# Patient Record
Sex: Female | Born: 1935 | Race: White | Hispanic: No | Marital: Married | Smoking: Never smoker
Health system: Southern US, Community
[De-identification: ages and names within clinical notes are randomized; demographics above are authoritative.]

## PROBLEM LIST (undated history)

## (undated) DIAGNOSIS — E785 Hyperlipidemia, unspecified: Secondary | ICD-10-CM

## (undated) DIAGNOSIS — E039 Hypothyroidism, unspecified: Secondary | ICD-10-CM

## (undated) DIAGNOSIS — R002 Palpitations: Secondary | ICD-10-CM

## (undated) DIAGNOSIS — I10 Essential (primary) hypertension: Secondary | ICD-10-CM

## (undated) DIAGNOSIS — E119 Type 2 diabetes mellitus without complications: Secondary | ICD-10-CM

## (undated) HISTORY — DX: Essential (primary) hypertension: I10

## (undated) HISTORY — DX: Type 2 diabetes mellitus without complications: E11.9

## (undated) HISTORY — DX: Hypothyroidism, unspecified: E03.9

## (undated) HISTORY — DX: Palpitations: R00.2

## (undated) HISTORY — DX: Hyperlipidemia, unspecified: E78.5

---

## 2002-09-16 ENCOUNTER — Encounter: Payer: Self-pay | Admitting: Unknown Physician Specialty

## 2002-09-16 ENCOUNTER — Ambulatory Visit (HOSPITAL_COMMUNITY): Admission: RE | Admit: 2002-09-16 | Discharge: 2002-09-16 | Payer: Self-pay | Admitting: Unknown Physician Specialty

## 2002-09-19 ENCOUNTER — Encounter: Payer: Self-pay | Admitting: Unknown Physician Specialty

## 2002-09-19 ENCOUNTER — Ambulatory Visit (HOSPITAL_COMMUNITY): Admission: RE | Admit: 2002-09-19 | Discharge: 2002-09-19 | Payer: Self-pay | Admitting: Unknown Physician Specialty

## 2002-10-08 ENCOUNTER — Ambulatory Visit (HOSPITAL_COMMUNITY): Admission: RE | Admit: 2002-10-08 | Discharge: 2002-10-08 | Payer: Self-pay | Admitting: Internal Medicine

## 2002-10-27 ENCOUNTER — Ambulatory Visit (HOSPITAL_COMMUNITY): Admission: RE | Admit: 2002-10-27 | Discharge: 2002-10-27 | Payer: Self-pay | Admitting: Internal Medicine

## 2002-10-27 ENCOUNTER — Encounter: Payer: Self-pay | Admitting: Internal Medicine

## 2003-02-17 ENCOUNTER — Ambulatory Visit (HOSPITAL_COMMUNITY): Admission: RE | Admit: 2003-02-17 | Discharge: 2003-02-17 | Payer: Self-pay | Admitting: Unknown Physician Specialty

## 2003-03-04 ENCOUNTER — Emergency Department (HOSPITAL_COMMUNITY): Admission: EM | Admit: 2003-03-04 | Discharge: 2003-03-04 | Payer: Self-pay | Admitting: Emergency Medicine

## 2003-11-19 ENCOUNTER — Ambulatory Visit (HOSPITAL_COMMUNITY): Admission: RE | Admit: 2003-11-19 | Discharge: 2003-11-19 | Payer: Self-pay | Admitting: Unknown Physician Specialty

## 2004-05-06 ENCOUNTER — Ambulatory Visit: Payer: Self-pay | Admitting: Family Medicine

## 2004-09-22 ENCOUNTER — Ambulatory Visit (HOSPITAL_COMMUNITY): Admission: RE | Admit: 2004-09-22 | Discharge: 2004-09-22 | Payer: Self-pay | Admitting: Ophthalmology

## 2004-11-10 ENCOUNTER — Ambulatory Visit (HOSPITAL_COMMUNITY): Admission: RE | Admit: 2004-11-10 | Discharge: 2004-11-10 | Payer: Self-pay | Admitting: Ophthalmology

## 2004-12-01 ENCOUNTER — Ambulatory Visit: Payer: Self-pay | Admitting: Family Medicine

## 2004-12-14 ENCOUNTER — Ambulatory Visit (HOSPITAL_COMMUNITY): Admission: RE | Admit: 2004-12-14 | Discharge: 2004-12-14 | Payer: Self-pay | Admitting: Family Medicine

## 2005-01-17 ENCOUNTER — Ambulatory Visit: Payer: Self-pay | Admitting: Family Medicine

## 2005-12-19 ENCOUNTER — Ambulatory Visit (HOSPITAL_COMMUNITY): Admission: RE | Admit: 2005-12-19 | Discharge: 2005-12-19 | Payer: Self-pay | Admitting: Family Medicine

## 2006-01-05 ENCOUNTER — Ambulatory Visit: Payer: Self-pay | Admitting: Family Medicine

## 2006-01-19 ENCOUNTER — Ambulatory Visit: Payer: Self-pay | Admitting: Physician Assistant

## 2006-01-25 ENCOUNTER — Ambulatory Visit: Payer: Self-pay | Admitting: Cardiology

## 2006-02-15 ENCOUNTER — Ambulatory Visit: Payer: Self-pay | Admitting: Cardiology

## 2006-07-13 ENCOUNTER — Ambulatory Visit: Payer: Self-pay | Admitting: Family Medicine

## 2006-08-13 ENCOUNTER — Ambulatory Visit: Payer: Self-pay | Admitting: Cardiology

## 2006-08-20 ENCOUNTER — Encounter (INDEPENDENT_AMBULATORY_CARE_PROVIDER_SITE_OTHER): Payer: Self-pay | Admitting: Internal Medicine

## 2006-12-21 ENCOUNTER — Ambulatory Visit (HOSPITAL_COMMUNITY): Admission: RE | Admit: 2006-12-21 | Discharge: 2006-12-21 | Payer: Self-pay | Admitting: Family Medicine

## 2007-12-24 ENCOUNTER — Ambulatory Visit (HOSPITAL_COMMUNITY): Admission: RE | Admit: 2007-12-24 | Discharge: 2007-12-24 | Payer: Self-pay | Admitting: Family Medicine

## 2008-12-28 ENCOUNTER — Ambulatory Visit (HOSPITAL_COMMUNITY): Admission: RE | Admit: 2008-12-28 | Discharge: 2008-12-28 | Payer: Self-pay | Admitting: Family Medicine

## 2009-11-29 ENCOUNTER — Emergency Department (HOSPITAL_COMMUNITY): Admission: EM | Admit: 2009-11-29 | Discharge: 2009-11-29 | Payer: Self-pay | Admitting: Emergency Medicine

## 2009-12-30 ENCOUNTER — Ambulatory Visit (HOSPITAL_COMMUNITY): Admission: RE | Admit: 2009-12-30 | Discharge: 2009-12-30 | Payer: Self-pay | Admitting: Family Medicine

## 2010-07-26 NOTE — Assessment & Plan Note (Signed)
[facility]                            CARDIOLOGY OFFICE NOTE   Rebecca, [name]                       MRN:          [id number]  DATE:08/13/2006                            DOB:          [birth year]    PRIMARY CARE PHYSICIAN:  Delaney Meigs, M.D.   REASON FOR VISIT:  A patient with multiple cardiovascular risk factors.   HISTORY OF PRESENT ILLNESS:  The patient returns for 82-month followup.  At the last visit, I performed an exercise treadmill test.  I found no  evidence of high-grade obstructive coronary artery disease and gave her  a specific recipe for exercise.  I am happy to see that she is  exercising most weeks, 5 days a week.  She goes to a 10% grade.  She  says she still sees her heart rate go up significantly very quickly.  She has not lost any weight, however, she feels good.  She is not  getting any chest discomfort.  She thinks her breathing is a little bit  better.  She has had no palpitations, presyncope or syncope.  She denies  any PND or orthopnea.   PAST MEDICAL HISTORY:  1. Dyslipidemia.  2. Appendectomy.  3. Foot surgery.  4. Hysterectomy.  5. Cyst recently resected from both breasts.   ALLERGIES:  NO KNOWN DRUG ALLERGIES.   MEDICATIONS:  Tums and vitamin D.   REVIEW OF SYSTEMS:  As stated in the HPI, otherwise negative for other  systems.   PHYSICAL EXAMINATION:  GENERAL:  The patient is in no distress.  VITAL SIGNS:  Blood pressure 122/79, heart rate 68 and regular, weight  199 pounds.  Body mass index 32.  NECK:  No jugular venous distention at 45 degrees.  Carotid upstrokes  brisk and symmetric.  No bruits or thyromegaly.  LYMPHATICS:  No adenopathy.  LUNGS:  Clear to auscultation bilaterally.  BACK:  No costovertebral angle tenderness.  CHEST:  Unremarkable.  HEART:  PMI not displaced or sustained.  S1, S2 within normal limits.  No S3, S4.  No clicks, murmurs or rubs.  ABDOMEN:  Obese, positive bowel  sounds, normal in frequency, pitch.  No  bruits, no rebound, no guarding.  No midline pulsatile masses or  splenomegaly.  SKIN:  No rashes.  EXTREMITIES:  2+ pulses, no edema.   EKG with sinus rhythm, rate 68, leftward axis, poor anterior R-wave  progression, nonspecific T-wave flattening.   ASSESSMENT/PLAN:  1. Tachycardia.  The patient reports some tachycardia when she      exercises.  However, I think this is probably appropriate.  She has      had no new symptoms and in particular no increase in her shortness      of breath.  I am very proud of her exercise regimen.  At this      point, I would not suggest further cardiovascular testing, but      would continue what she is doing.  2. Followup.  I do plan to see the patient back next summer for repeat      exercise  treadmill test to judge whether she has had any      significant process particularly in response to her exercise heart      rate.     Rollene Rotunda, MD, [facility]  Electronically Signed    JH/MedQ  DD: 08/13/2006  DT: 08/14/2006  Job #: [id number]   cc:   Delaney Meigs, M.D.

## 2010-07-29 NOTE — Assessment & Plan Note (Signed)
[facility]                              CARDIOLOGY OFFICE NOTE   [name], [name]                       MRN:          [id number]  DATE:01/25/2006                            DOB:          Dec 17, 1932    REASON FOR PRESENTATION:  Patient with multiple cardiovascular risk factors.   HISTORY OF PRESENT ILLNESS:  The patient is a very pleasant 77 year old  white female without prior cardiac history.  She does have significant  cardiovascular risk factors as described below.  Because of this and poor R  wave progression on her EKG, she was referred for further evaluation.   The patient does do some exercising at the [facility].  She goes a couple times a  week and she says she gets her heart rate up.  With this she denies any  chest pressure, neck discomfort, arm discomfort, activity-induced  nausea,  vomiting, chest pain, or diaphoresis.  She has had no palpitations.  No  presyncope or syncope.  She denies any PND or orthopnea.  She does get a  little dyspneic with moderate exertion but this is baseline.   PAST MEDICAL HISTORY:  She has had no history of hypertension.  She has  recently been found to have some dyslipidemia with an LDL of 122.   PAST SURGICAL HISTORY:  1. Appendectomy.  2. Foot surgery.  3. Hysterectomy.  4. Cysts removed from both breasts.   ALLERGIES:  None.   CURRENT MEDICATIONS:  Vitamin D, Tums, glucosamine.   SOCIAL HISTORY:  The patient is retired.  She was an Recruitment consultant. She is married.  She has no children.   FAMILY HISTORY:  Contributory for her father having heart disease and dying  of an MI in his 45s, brother had heart disease in his early 73s.  Otherwise  negative for other family members.   REVIEW OF SYSTEMS:  As stated in HPI.  Negative for other systems.   PHYSICAL EXAMINATION:  GENERAL:  The patient is in no distress.  VITAL SIGNS:  Blood pressure 122/70.  Heart rate 72 and regular.  Weight  200  pounds.  Body mass index 32.  HEENT:  Eyes unremarkable.  Pupils equally round and react to light.  Fundi  not visualized.  Oral mucosa unremarkable.  NECK:  No jugular venous distention.  Wave form within normal limits.  Carotid upstroke brisk and symmetrical.  No bruits.  No thyromegaly.  LYMPHATICS:  No cervical, axillary, or inguinal adenopathy.  LUNGS:  Clear to auscultation bilaterally.  BACK:  No costovertebral angle tenderness.  CHEST:  Unremarkable.  HEART:  PMI not displaced or sustained.  S1, S2 within normal limits.  No  S3, no S4, no murmurs.  ABDOMEN:  Obese, positive bowel sounds.  Normal in frequency and pitch.  No  bruits.  No rebound.  No guarding.  Normal midline pulse.  No mass.  No  hepatosplenomegaly.  SKIN:  No rashes.  No nodules.  EXTREMITIES:  Pulses 2+ throughout.  No edema.  No cyanosis.  No clubbing.  NEURO:  Oriented to person, place,  and time.  Cranial nerves II through XII  grossly intact.  Motor grossly intact.   EKG:  Sinus rhythm, rate 75, axis within normal limits, intervals within  normal limits.  Poor anterior R wave progression.   ASSESSMENT AND PLAN:  1. Abnormal EKG.  The patient does have poor anterior R wave progression      which is probably related to lead placement.  However, she has multiple      cardiovascular risk factors.  I do not have any evidence for a high-      grade obstructive coronary lesion.  However, she may well have plaque      and should be risk stratified.  I am going to put her on a treadmill      and do an exercise treadmill test.  This will allow me to exclude high-      grade obstructive disease, risk stratify, and most importantly give her      a prescription for exercise.  2. Dyslipidemia.  Will discuss further management based on the results of      treadmill test.  3. Obesity.  We discussed weight loss with calorie counting and a [employer].  4. Follow up.  Will see her at the time of her  treadmill.     Rollene Rotunda, MD, [facility]  Electronically Signed    JH/MedQ  DD: 01/25/2006  DT: 01/25/2006  Job #: [id number]   cc:   Delaney Meigs, M.D.

## 2010-07-29 NOTE — Procedures (Signed)
[facility]                              EXERCISE TREADMILL   [name], [name]                       MRN:          [id number]  DATE:02/15/2006                            DOB:          1932/05/10    PRIMARY:  Dr. Joette Catching.   PROCEDURE:  Exercise treadmill test.   INDICATIONS:  Evaluate the patient with abnormal EKG pointing to her R-  wave progression and multiple cardiovascular risk factors.   PROCEDURE NOTE:  The patient was exercised using standard Bruce  protocol.  She was only able to exercise for 4 minutes and 30 seconds.  This was 1 minute and 30 seconds in to stage 2.  She achieved 6.4 METS.  The test was terminated because she had achieved a target heart rate but  mostly because she was short of breath and fatigued.  She had no chest  discomfort or other angina equivalent.  There was one ventricular  couplet noted during exercise but none during recovery.  She had an  appropriate blood pressure response with a maximum of 161/89.  Her  maximum heart rate was 151 which is 101% of predicted.  There were no  ischemic ST or T-wave changes.  She did have a normal heart rate  recovery.   CONCLUSION:  Negative adequate exercise treadmill test.  She does have a  poor exercise tolerance.  Given this, she would be in an intermediate  risk category for future cardiovascular events.  However, there is no  evidence for high-grade obstructive coronary disease.   PLAN:  Based on the above, I discussed with her a specific exercise  regimen and plans for primary risk reduction.  I would like to see her  back in six months.  I would recommend another stress test in about a  year or sooner if she has any symptoms.  Because of her shortness of  breath, I am going to check a BNP.  If this is elevated, we will go with  an echocardiogram.     Rollene Rotunda, MD, [facility]  Electronically Signed    JH/MedQ  DD: 02/15/2006  DT: 02/16/2006  Job #:  [id number]   cc:   Delaney Meigs, M.D.

## 2010-07-29 NOTE — Op Note (Signed)
[name], [name]                ACCOUNT NO.:  [id number]   MEDICAL RECORD NO.:  [id number]          PATIENT TYPE:  AMB   LOCATION:  DAY                           FACILITY:  [facility]   PHYSICIAN:  Trish Fountain, MD    DATE OF BIRTH:  06-26-1932   DATE OF PROCEDURE:  09/22/2004  DATE OF DISCHARGE:  09/22/2004                                 OPERATIVE REPORT   /PREOPERATIVE DIAGNOSIS:  Cataract, left eye.   POSTOPERATIVE DIAGNOSIS:  Cataract, left eye.   SURGERY:  Kelman phacoemulsification, left eye, with posterior chamber  intraocular lens, left eye.   ANESTHESIA:  MAC with topical anesthesia of the left eye.   SURGEON:  Trish Fountain, MD   SPECIMENS:  None.   COMPLICATIONS:  None.   LENS MODEL:  The model number for the intraocular lens was a 18.5 Diopter  CLRFLXC AMO lens, serial # [id number].   HISTORY:  This is a 77 year old female with progressive decrease in vision  in the right eye.   DESCRIPTION OF PROCEDURE:  In the preoperative area, the patient had  Cyclogyl and Neo-Synephrine drops in the left eye in order to dilate the eye  along with Tetracaine to help anesthetize the eye.  Once the patient's left  eye was dilated, the patient was taken to the operating room and prepped.  The left eye was prepped and draped in the usual sterile manner.  A lid  speculum was placed in the left eye, and 2% Xylocaine jelly was placed in  the left eye as well.  A paracentesis was made through clear cornea at the  limbus at approximately the 5 o'clock position of the left eye.  Nonpreserved Xylocaine 1% 1 cc was placed into the anterior chamber for one  minute.  Viscoat was then used to fill the anterior chamber.  Using a 2.75  mm blade at the 3 o'clock position, an incision into the anterior chamber  was made through clear cornea near the limbus.  Viscoat was again used to  reform the anterior chamber.  A 25 gauge bent capsulotomy needle was used to  begin the capsulorrhexis  through the anterior capsule of the lens.  Utrata  forceps were used to make a 360 degree anterior capsulorrhexis.  A Chang 27  gauge irrigating cannula was used to hydrodissect and hydrodelineate the  nucleus.  Once hydrodissection and hydrodelineation was carried out, [facility]  phacoemulsification was used to make a deep groove in the lens nucleus.  The  lens was rotated 360 degrees and divided into four quadrants using deep  grooves made by phacoemulsification with the [facility] phacoemulsification tip.  The nucleus was then divided using the phaco tip and the nucleus  manipulator.  The nuclear quadrants were then removed using  phacoemulsification.  The irrigation aspiration was then used to remove the  remainder of the cortex.  The anterior chamber and posterior capsule was  filled with Provisc, and the 3 o'clock position incision was slightly  widened, using the same 2.75 mm blade that was initially used to make the  incision.  An intraocular lens was placed in the shooter, and this was  placed in the eye, followed by placement of the trailing haptic into the  posterior capsule, using the Kugelan.  Irrigation/aspiration was then used  to remove Provisc from the anterior chamber and the posterior capsule.  BSS  on a syringe was then used to hydrate the cornea at the 3 o'clock incision  site.  The incision site was then checked for water tightness, using a Weck-  cel.  Half-strength Betadine solution was placed, 1 drop, in the inner  canthus, and 1 drop in the outer canthus.  After one minute, this was rinsed  from the eye.  Drops were placed in the eye, Vigamox, followed by Nevanac  followed by Econopred.  A shield was placed over the patient's left eye, and  the patient was sent to the recovery room in satisfactory condition.       PVK/MEDQ  D:  09/22/2004  T:  09/22/2004  Job:  [id number]

## 2010-07-29 NOTE — Op Note (Signed)
NAME:  Rebecca, [name]                          ACCOUNT NO.:  [id number]   MEDICAL RECORD NO.:  [id number]                   PATIENT TYPE:  AMB   LOCATION:  DAY                                  FACILITY:  [facility]   PHYSICIAN:  R. Roetta Sessions, M.D.              DATE OF BIRTH:  [birth year]   DATE OF PROCEDURE:  DATE OF DISCHARGE:                                 OPERATIVE REPORT   PROCEDURE:  Colonoscopy with biopsy.   ENDOSCOPIST:  Gerrit Friends. Rourk, M.D.   INDICATIONS FOR PROCEDURE:  The patient is a 77 year old lady sent over at  the courtesy of Dr. [name] for colorectal cancer screening.  There is  no family history of colorectal cancer.  There is no prior history of her  having any imaging studies of her colon performed.  She is devoid of any  lower GI tract symptoms. Colonoscopy is now being done as a standard  screening maneuver.  This approach has been discussed with the patient at  length. The potential risks, benefits, and alternatives have been reviewed,  questions answered.  Please see my handwritten H&P for more information.   PROCEDURE NOTE:  O2 saturation, blood pressure, pulse and respirations were  monitored throughout the entire procedure.  Conscious sedation: Versed 5 mg IV, Demerol 75 mg IV in divided doses.   INSTRUMENT:  Olympus video chip adult colonoscope.   FINDINGS:  A digital rectal examination revealed no abnormalities.   ENDOSCOPIC FINDINGS:  The prep was adequate.   RECTUM:  Examination of the rectal mucosa including the retroflex view of  the anal verge revealed no abnormalities.   COLON:  The colonic mucosa was surveyed from the rectosigmoid junction  proximally.  The patient had scattered pancolonic diverticula.  She had a  long redundant colon.  I advanced the scope to what I believed to be the  proximal half of the colon.  I was unable to advance further due to  recurrent looping in spite of a multitude of different maneuvers including  changing of the patient's position and external abdominal pressure.  I never  saw the cecum or ileocecal valve.  The scope was then withdrawn.  All  previously mentioned mucosal surfaces were again seen.  There was a 4 mm  polyp in the proximal colon which was cold biopsy/removed.  No other  abnormalities were observed.  The patient tolerated the procedure well and  was reacted in endoscopy.   IMPRESSION:  1. Normal rectum.  2. Long redundant colon with scattered pancolonic diverticula.  3. The remainder of the polyp removed as described above.  Redundancy of the     colon prevented advancement of the scope to the cecum.   RECOMMENDATIONS:  1. Follow up on path.  2. Air contrast barium enema in 3 weeks to complement today's examination.  Jonathon Bellows, M.D.    RMR/MEDQ  D:  10/08/2002  T:  10/08/2002  Job:  [id number]   cc:   [name], MD  [address]  [place]  [place] [address]  Fax: [phone]

## 2010-07-29 NOTE — Op Note (Signed)
[name], [name]                ACCOUNT NO.:  [id number]   MEDICAL RECORD NO.:  [id number]          PATIENT TYPE:  AMB   LOCATION:  DAY                           FACILITY:  [facility]   PHYSICIAN:  Trish Fountain, MD    DATE OF BIRTH:  Jun 25, 1932   DATE OF PROCEDURE:  11/10/2004  DATE OF DISCHARGE:                                 OPERATIVE REPORT   /PREOPERATIVE DIAGNOSIS:  Cataract, left eye.   POSTOPERATIVE DIAGNOSIS:  Cataract, left eye.   SURGERY:  Kelman phacoemulsification, left eye, with posterior chamber  intraocular lens, left eye.   ANESTHESIA:  MAC with topical anesthesia of the left eye.   SURGEON:  Trish Fountain, MD   SPECIMENS:  None.   COMPLICATIONS:  None.   HISTORY:  This is a 77 year old female who has slowly progressive decrease  in vision in the left eye.   LENS MODEL:  AMO CLRFLXC, 17.5 diopter lens, serial # [id number].   DESCRIPTION OF PROCEDURE:  In the preoperative area, the patient had  Cyclogyl and Neo-Synephrine drops in the left eye in order to dilate the eye  along with Tetracaine to help anesthetize the eye.  Once the patient's left  eye was dilated, the patient was taken to the operating room and prepped.  The left eye was prepped and draped in the usual sterile manner.  A lid  speculum was placed in the left eye, and 2% Xylocaine jelly was placed in  the left eye as well.  A paracentesis was made through clear cornea at the  limbus at approximately the 5 o'clock position of the left eye.  Nonpreserved Xylocaine 1% 1 cc was placed into the anterior chamber for one  minute.  Viscoat was then used to fill the anterior chamber.  Using a 2.75  mm blade at the 3 o'clock position, an incision into the anterior chamber  was made through clear cornea near the limbus.  Viscoat was again used to  reform the anterior chamber.  A 25 gauge bent capsulotomy needle was used to  begin the capsulorrhexis through the anterior capsule of the lens.  Utrata  forceps were used to make a 360 degree anterior capsulorrhexis.  A Chang 27  gauge irrigating cannula was used to hydrodissect and hydrodelineate the  nucleus.  Once hydrodissection and hydrodelineation was carried out, [facility]  phacoemulsification was used to make a deep groove in the lens nucleus.  The  lens was rotated 360 degrees and divided into four quadrants using deep  grooves made by phacoemulsification with the [facility] phacoemulsification tip.  The nucleus was then divided using the phaco tip and the nucleus  manipulator.  The nuclear quadrants were then removed using  phacoemulsification.  The irrigation aspiration was then used to remove the  remainder of the cortex.  The anterior chamber and posterior capsule was  filled with Provisc, and the 3 o'clock position incision was slightly  widened, using the same 2.75 mm blade that was initially used to make the  incision.  An intraocular lens was placed in the shooter, and this was  placed in the eye, followed by placement of the trailing haptic into the  posterior capsule, using the Kugelan.  Irrigation/aspiration was then used  to remove Provisc from the anterior chamber and the posterior capsule.  BSS  on a syringe was then used to hydrate the cornea at the 3 o'clock incision  site.  The incision site was then checked for water tightness, using a Weck-  cel.  Half-strength Betadine solution was placed, 1 drop, in the inner  canthus, and 1 drop in the outer canthus.  After one minute, this was rinsed  from the eye.  Drops were placed in the eye, Vigamox, followed by Nevanac  followed by Econopred.  A shield was placed over the patient's left eye, and  the patient was sent to the recovery room in satisfactory condition.      Trish Fountain, MD  Electronically Signed     PVK/MEDQ  D:  11/11/2004  T:  11/11/2004  Job:  [id number]

## 2010-12-22 ENCOUNTER — Other Ambulatory Visit (HOSPITAL_COMMUNITY): Payer: Self-pay | Admitting: Family Medicine

## 2010-12-22 DIAGNOSIS — Z139 Encounter for screening, unspecified: Secondary | ICD-10-CM

## 2011-01-03 ENCOUNTER — Ambulatory Visit (HOSPITAL_COMMUNITY)
Admission: RE | Admit: 2011-01-03 | Discharge: 2011-01-03 | Disposition: A | Discharge status: Home / Self Care | Payer: Medicare Other | Source: Ambulatory Visit | Attending: Family Medicine | Admitting: Family Medicine

## 2011-01-03 DIAGNOSIS — Z139 Encounter for screening, unspecified: Secondary | ICD-10-CM

## 2011-01-03 DIAGNOSIS — Z1231 Encounter for screening mammogram for malignant neoplasm of breast: Secondary | ICD-10-CM | POA: Insufficient documentation

## 2011-11-29 ENCOUNTER — Other Ambulatory Visit (HOSPITAL_COMMUNITY): Payer: Self-pay | Admitting: Family Medicine

## 2011-11-29 DIAGNOSIS — Z139 Encounter for screening, unspecified: Secondary | ICD-10-CM

## 2012-01-08 ENCOUNTER — Ambulatory Visit (HOSPITAL_COMMUNITY)
Admission: RE | Admit: 2012-01-08 | Discharge: 2012-01-08 | Disposition: A | Discharge status: Home / Self Care | Payer: Medicare Other | Source: Ambulatory Visit | Attending: Family Medicine | Admitting: Family Medicine

## 2012-01-08 DIAGNOSIS — Z139 Encounter for screening, unspecified: Secondary | ICD-10-CM

## 2012-01-08 DIAGNOSIS — Z1231 Encounter for screening mammogram for malignant neoplasm of breast: Secondary | ICD-10-CM | POA: Insufficient documentation

## 2012-12-03 ENCOUNTER — Other Ambulatory Visit (HOSPITAL_COMMUNITY): Payer: Self-pay | Admitting: Family Medicine

## 2012-12-03 DIAGNOSIS — Z139 Encounter for screening, unspecified: Secondary | ICD-10-CM

## 2013-01-09 ENCOUNTER — Ambulatory Visit (HOSPITAL_COMMUNITY)
Admission: RE | Admit: 2013-01-09 | Discharge: 2013-01-09 | Disposition: A | Discharge status: Home / Self Care | Payer: Medicare Other | Source: Ambulatory Visit | Attending: Family Medicine | Admitting: Family Medicine

## 2013-01-09 DIAGNOSIS — Z139 Encounter for screening, unspecified: Secondary | ICD-10-CM

## 2013-01-09 DIAGNOSIS — Z1231 Encounter for screening mammogram for malignant neoplasm of breast: Secondary | ICD-10-CM | POA: Insufficient documentation

## 2013-12-11 ENCOUNTER — Other Ambulatory Visit (HOSPITAL_COMMUNITY): Payer: Self-pay | Admitting: Family Medicine

## 2013-12-11 DIAGNOSIS — Z139 Encounter for screening, unspecified: Secondary | ICD-10-CM

## 2014-01-12 ENCOUNTER — Ambulatory Visit (HOSPITAL_COMMUNITY)
Admission: RE | Admit: 2014-01-12 | Discharge: 2014-01-12 | Disposition: A | Discharge status: Home / Self Care | Payer: Medicare Other | Source: Ambulatory Visit | Attending: Family Medicine | Admitting: Family Medicine

## 2014-01-12 DIAGNOSIS — Z139 Encounter for screening, unspecified: Secondary | ICD-10-CM

## 2014-01-12 DIAGNOSIS — Z1231 Encounter for screening mammogram for malignant neoplasm of breast: Secondary | ICD-10-CM | POA: Diagnosis present

## 2014-12-21 ENCOUNTER — Other Ambulatory Visit (HOSPITAL_COMMUNITY): Payer: Self-pay | Admitting: Family Medicine

## 2014-12-21 DIAGNOSIS — Z1231 Encounter for screening mammogram for malignant neoplasm of breast: Secondary | ICD-10-CM

## 2015-01-18 ENCOUNTER — Ambulatory Visit (HOSPITAL_COMMUNITY): Payer: Self-pay

## 2015-01-21 ENCOUNTER — Ambulatory Visit (HOSPITAL_COMMUNITY)
Admission: RE | Admit: 2015-01-21 | Discharge: 2015-01-21 | Disposition: A | Discharge status: Home / Self Care | Payer: Medicare Other | Source: Ambulatory Visit | Attending: Family Medicine | Admitting: Family Medicine

## 2015-01-21 DIAGNOSIS — Z1231 Encounter for screening mammogram for malignant neoplasm of breast: Secondary | ICD-10-CM | POA: Insufficient documentation

## 2015-02-17 ENCOUNTER — Other Ambulatory Visit (HOSPITAL_COMMUNITY): Payer: Self-pay | Admitting: Family Medicine

## 2015-02-17 DIAGNOSIS — E2839 Other primary ovarian failure: Secondary | ICD-10-CM

## 2015-02-25 ENCOUNTER — Ambulatory Visit (HOSPITAL_COMMUNITY)
Admission: RE | Admit: 2015-02-25 | Discharge: 2015-02-25 | Disposition: A | Discharge status: Home / Self Care | Payer: Medicare Other | Source: Ambulatory Visit | Attending: Family Medicine | Admitting: Family Medicine

## 2015-02-25 DIAGNOSIS — M8580 Other specified disorders of bone density and structure, unspecified site: Secondary | ICD-10-CM | POA: Insufficient documentation

## 2015-02-25 DIAGNOSIS — E2839 Other primary ovarian failure: Secondary | ICD-10-CM | POA: Diagnosis present

## 2015-02-25 DIAGNOSIS — Z13820 Encounter for screening for osteoporosis: Secondary | ICD-10-CM | POA: Insufficient documentation

## 2015-12-31 ENCOUNTER — Encounter: Payer: Self-pay | Admitting: *Deleted

## 2016-01-03 ENCOUNTER — Ambulatory Visit (INDEPENDENT_AMBULATORY_CARE_PROVIDER_SITE_OTHER): Payer: Medicare HMO | Admitting: Cardiology

## 2016-01-03 ENCOUNTER — Encounter: Payer: Self-pay | Admitting: Cardiology

## 2016-01-03 ENCOUNTER — Encounter: Payer: Self-pay | Admitting: *Deleted

## 2016-01-03 VITALS — BP 118/78 | HR 70 | Ht 66.0 in | Wt 188.0 lb

## 2016-01-03 DIAGNOSIS — R9431 Abnormal electrocardiogram [ECG] [EKG]: Secondary | ICD-10-CM | POA: Diagnosis not present

## 2016-01-03 DIAGNOSIS — R002 Palpitations: Secondary | ICD-10-CM

## 2016-01-03 NOTE — Patient Instructions (Signed)
Your physician recommends that you schedule a follow-up appointment in: 6 WEEKS WITH DR BRANCH  Your physician recommends that you continue on your current medications as directed. Please refer to the Current Medication list given to you today.  Your physician has recommended that you wear an event monitor 21 DAY EVENT MONITOR. Event monitors are medical devices that record the heart's electrical activity. Doctors most often us these monitors to diagnose arrhythmias. Arrhythmias are problems with the speed or rhythm of the heartbeat. The monitor is a small, portable device. You can wear one while you do your normal daily activities. This is usually used to diagnose what is causing palpitations/syncope (passing out).  Thank you for choosing [facility]!! \    

## 2016-01-03 NOTE — Progress Notes (Signed)
Clinical Summary [name] is a 82 y.o.female seen today as a new patient. She was last seen by Dr Antoine Poche in 2008. She is referred by Dr Lysbeth Galas.   1. Palpitations - started a few months ago. Feeling of heart racing, can feel in chest and hear in her ears - associated with SOB, no chest pain, no dizziness. - can last several hours at time. Occurs sporadically, on average 3-4 times week - no specific pattern, no specific trigger - 1 cup coffee in AM, rare tea, no sodas, no energy drinks, no EtOH - no exertional symptoms.   SH: works at [employer].     Past Medical History:  Diagnosis Date  . Diabetes mellitus without complication (HCC)   . Hyperlipidemia   . Hypertension   . Hypothyroidism   . Palpitations      No Known Allergies   Current Outpatient Prescriptions  Medication Sig Dispense Refill  . Cholecalciferol (VITAMIN D PO) Take by mouth.    . levothyroxine (SYNTHROID, LEVOTHROID) 100 MCG tablet Take 100 mcg by mouth daily before breakfast.    . lisinopril (PRINIVIL,ZESTRIL) 5 MG tablet Take 5 mg by mouth daily.    . metFORMIN (GLUCOPHAGE-XR) 500 MG 24 hr tablet Take 500 mg by mouth daily with breakfast.    . pravastatin (PRAVACHOL) 20 MG tablet Take 20 mg by mouth daily.     No current facility-administered medications for this visit.        No Known Allergies    Family History  father heart disease and dying of an MI in his 65s, brother had heart disease in his early 89s   Social History [name] reports that she has never smoked. She has never used smokeless tobacco. [name] has no alcohol history on file.   Review of Systems CONSTITUTIONAL: No weight loss, fever, chills, weakness or fatigue.  HEENT: Eyes: No visual loss, blurred vision, double vision or yellow sclerae.No hearing loss, sneezing, congestion, runny nose or sore throat.  SKIN: No rash or itching.  CARDIOVASCULAR: per HPI RESPIRATORY: No shortness of breath, cough or  sputum.  GASTROINTESTINAL: No anorexia, nausea, vomiting or diarrhea. No abdominal pain or blood.  GENITOURINARY: No burning on urination, no polyuria NEUROLOGICAL: No headache, dizziness, syncope, paralysis, ataxia, numbness or tingling in the extremities. No change in bowel or bladder control.  MUSCULOSKELETAL: No muscle, back pain, joint pain or stiffness.  LYMPHATICS: No enlarged nodes. No history of splenectomy.  PSYCHIATRIC: No history of depression or anxiety.  ENDOCRINOLOGIC: No reports of sweating, cold or heat intolerance. No polyuria or polydipsia.  Marland Kitchen   Physical Examination Vitals:   01/03/16 0915  BP: 118/78  Pulse: 70   Filed Weights   01/03/16 0915  Weight: 188 lb (85.3 kg)    Gen: resting comfortably, no acute distress HEENT: no scleral icterus, pupils equal round and reactive, no palptable cervical adenopathy,  CV Resp: Clear to auscultation bilaterally GI: abdomen is soft, non-tender, non-distended, normal bowel sounds, no hepatosplenomegaly MSK: extremities are warm, no edema.  Skin: warm, no rash Neuro:  no focal deficits Psych: appropriate affect     Assessment and Plan  1. Palpitations - baseline clinic  EKG shows NSR. - we will obtain 21 day event monitor to further evaluate - counseled to limit caffeine and EtoH  2. Abnormal EKG - lateral and anteroseptal Qwaves. No significant chest pain or SOB, no prior history of CAD - consider echo at next visit  to evaluate for WMA.s   F/u 6 weeks.       Antoine PocheJonathan F. Anfernee Peschke, M.D.

## 2016-01-06 ENCOUNTER — Ambulatory Visit (INDEPENDENT_AMBULATORY_CARE_PROVIDER_SITE_OTHER): Payer: Medicare HMO

## 2016-01-06 DIAGNOSIS — R002 Palpitations: Secondary | ICD-10-CM

## 2016-01-07 ENCOUNTER — Other Ambulatory Visit (HOSPITAL_COMMUNITY): Payer: Self-pay | Admitting: Family Medicine

## 2016-01-07 DIAGNOSIS — Z1231 Encounter for screening mammogram for malignant neoplasm of breast: Secondary | ICD-10-CM

## 2016-01-26 ENCOUNTER — Ambulatory Visit (HOSPITAL_COMMUNITY)
Admission: RE | Admit: 2016-01-26 | Discharge: 2016-01-26 | Disposition: A | Discharge status: Home / Self Care | Payer: Medicare HMO | Source: Ambulatory Visit | Attending: Family Medicine | Admitting: Family Medicine

## 2016-01-26 DIAGNOSIS — Z1231 Encounter for screening mammogram for malignant neoplasm of breast: Secondary | ICD-10-CM | POA: Insufficient documentation

## 2016-02-08 ENCOUNTER — Telehealth: Payer: Self-pay | Admitting: *Deleted

## 2016-02-08 NOTE — Telephone Encounter (Signed)
Pt aware and works in afternoon - will come Thursday @ 940AM - routed to pcp

## 2016-02-08 NOTE — Telephone Encounter (Signed)
-----   Message from Antoine PocheJonathan F Branch, MD sent at 02/08/2016 12:45 PM EST ----- Monitor shows some episodes of atrial fibrillation, an abnormal rhythm that is very common and is the cause of her symptoms. Can she see me this week in one of our openings, if not can put on 340pm on Wed or Thurs to discuss treatment  Dominga FerryJ Branch MD

## 2016-02-10 ENCOUNTER — Encounter: Payer: Self-pay | Admitting: Cardiology

## 2016-02-10 ENCOUNTER — Ambulatory Visit (INDEPENDENT_AMBULATORY_CARE_PROVIDER_SITE_OTHER): Payer: Medicare HMO | Admitting: Cardiology

## 2016-02-10 VITALS — BP 101/69 | HR 89 | Ht 66.0 in | Wt 188.8 lb

## 2016-02-10 DIAGNOSIS — I4891 Unspecified atrial fibrillation: Secondary | ICD-10-CM | POA: Diagnosis not present

## 2016-02-10 MED ORDER — METOPROLOL TARTRATE 25 MG PO TABS: 12.5000 mg | ORAL_TABLET | Freq: Two times a day (BID) | ORAL | 3 refills | Status: DC

## 2016-02-10 MED ORDER — METOPROLOL TARTRATE 25 MG PO TABS: 25.0000 mg | ORAL_TABLET | Freq: Two times a day (BID) | ORAL | 3 refills | Status: DC

## 2016-02-10 MED ORDER — APIXABAN 5 MG PO TABS: 5.0000 mg | ORAL_TABLET | Freq: Two times a day (BID) | ORAL | 3 refills | Status: DC

## 2016-02-10 MED ORDER — APIXABAN 5 MG PO TABS: 5.0000 mg | ORAL_TABLET | Freq: Two times a day (BID) | ORAL | 0 refills | Status: DC

## 2016-02-10 NOTE — Progress Notes (Signed)
Clinical Summary [name] is a 82 y.o.female seen today for follow up of the following medical problems.   1. Palpitations - started a few months ago. Feeling of heart racing, can feel in chest and hear in her ears - associated with SOB, no chest pain, no dizziness. - can last several hours at time. Occurs sporadically, on average 3-4 times week - no specific pattern, no specific trigger - 1 cup coffee in AM, rare tea, no sodas, no energy drinks, no EtOH - no exertional symptoms.   - since last visit she completed an event monitor that showed episodes of aflutter and afib.  - CHADS2VASC is 5, currently not on anticoag   SH: works at [employer].    Past Medical History:  Diagnosis Date  . Diabetes mellitus without complication (HCC)   . Hyperlipidemia   . Hypertension   . Hypothyroidism   . Palpitations      No Known Allergies   Current Outpatient Prescriptions  Medication Sig Dispense Refill  . Cholecalciferol (VITAMIN D PO) Take by mouth.    . levothyroxine (SYNTHROID, LEVOTHROID) 100 MCG tablet Take 100 mcg by mouth daily before breakfast.    . lisinopril (PRINIVIL,ZESTRIL) 5 MG tablet Take 5 mg by mouth daily.    . metFORMIN (GLUCOPHAGE-XR) 500 MG 24 hr tablet Take 500 mg by mouth daily with breakfast.    . pravastatin (PRAVACHOL) 20 MG tablet Take 20 mg by mouth daily.     No current facility-administered medications for this visit.      No past surgical history on file.   No Known Allergies    No family history on file.   Social History [name] reports that she has never smoked. She has never used smokeless tobacco. [name] has no alcohol history on file.   Review of Systems CONSTITUTIONAL: No weight loss, fever, chills, weakness or fatigue.  HEENT: Eyes: No visual loss, blurred vision, double vision or yellow sclerae.No hearing loss, sneezing, congestion, runny nose or sore throat.  SKIN: No rash or itching.  CARDIOVASCULAR: per  hpi RESPIRATORY: No shortness of breath, cough or sputum.  GASTROINTESTINAL: No anorexia, nausea, vomiting or diarrhea. No abdominal pain or blood.  GENITOURINARY: No burning on urination, no polyuria NEUROLOGICAL: No headache, dizziness, syncope, paralysis, ataxia, numbness or tingling in the extremities. No change in bowel or bladder control.  MUSCULOSKELETAL: No muscle, back pain, joint pain or stiffness.  LYMPHATICS: No enlarged nodes. No history of splenectomy.  PSYCHIATRIC: No history of depression or anxiety.  ENDOCRINOLOGIC: No reports of sweating, cold or heat intolerance. No polyuria or polydipsia.  Marland Kitchen.   Physical Examination Vitals:   02/10/16 0942  BP: 101/69  Pulse: 89   Vitals:   02/10/16 0942  Weight: 188 lb 12.8 oz (85.6 kg)  Height: 5\' 6"  (1.676 m)    Gen: resting comfortably, no acute distress HEENT: no scleral icterus, pupils equal round and reactive, no palptable cervical adenopathy,  CV: RRR, no m/r/g, no jvd Resp: Clear to auscultation bilaterally GI: abdomen is soft, non-tender, non-distended, normal bowel sounds, no hepatosplenomegaly MSK: extremities are warm, no edema.  Skin: warm, no rash Neuro:  no focal deficits Psych: appropriate affect   Diagnostic Studies 12/2015 event monitor  Telemetry tracings show sinus rhythm along with episodes of atrial flutter with variable conduction and atrial fibrillation.  Episodes of both atrial flutter and atrial fibrillations, at times with elevated heart rates 100-120s  Reported symptoms correlated to episode of  aflutter with RVR    Assessment and Plan  1. PAF/Paroxysmal aflutter - new diagnosis based on recent event monitor - start lopressor 12.5mg  bid - CHADS2Vasc score is 5, stop asa and start eliquis 5mg  bid - obtain echo  F/u 4 months.          Antoine PocheJonathan F. Noura Purpura, M.D.

## 2016-02-10 NOTE — Patient Instructions (Addendum)
Your physician recommends that you schedule a follow-up appointment in: 4 MONTHS WITH DR. BRANCH  Your physician has recommended you make the following change in your medication:   STOP ASPIRIN  START ELIQUIS 5 MG TWICE DAILY  START METOPROLOL (LOPRESSOR) 12.5 MG TWICE DAILY  Your physician has requested that you have an echocardiogram. Echocardiography is a painless test that uses sound waves to create images of your heart. It provides your doctor with information about the size and shape of your heart and how well your heart's chambers and valves are working. This procedure takes approximately one hour. There are no restrictions for this procedure.  Thank you for choosing [facility]!!

## 2016-02-16 ENCOUNTER — Ambulatory Visit: Payer: Medicare HMO | Admitting: Cardiology

## 2016-02-24 ENCOUNTER — Other Ambulatory Visit: Payer: Medicare HMO

## 2016-03-02 ENCOUNTER — Ambulatory Visit (INDEPENDENT_AMBULATORY_CARE_PROVIDER_SITE_OTHER): Payer: Medicare HMO

## 2016-03-02 ENCOUNTER — Other Ambulatory Visit: Payer: Self-pay

## 2016-03-02 DIAGNOSIS — I4891 Unspecified atrial fibrillation: Secondary | ICD-10-CM | POA: Diagnosis not present

## 2016-03-03 ENCOUNTER — Telehealth: Payer: Self-pay | Admitting: *Deleted

## 2016-03-03 NOTE — Telephone Encounter (Signed)
LM on pt VM of results - routed to pcp

## 2016-03-03 NOTE — Telephone Encounter (Signed)
-----   Message from Antoine PocheJonathan F Branch, MD sent at 03/03/2016  1:23 PM EST ----- Echo looks good  J BrancH MD

## 2016-06-02 ENCOUNTER — Encounter: Payer: Self-pay | Admitting: Cardiology

## 2016-06-02 ENCOUNTER — Encounter: Payer: Self-pay | Admitting: *Deleted

## 2016-06-02 ENCOUNTER — Ambulatory Visit (INDEPENDENT_AMBULATORY_CARE_PROVIDER_SITE_OTHER): Payer: Medicare Other | Admitting: Cardiology

## 2016-06-02 VITALS — BP 125/73 | HR 59 | Resp 95 | Ht 66.0 in | Wt 194.8 lb

## 2016-06-02 DIAGNOSIS — I4891 Unspecified atrial fibrillation: Secondary | ICD-10-CM | POA: Diagnosis not present

## 2016-06-02 MED ORDER — APIXABAN 5 MG PO TABS: 5.0000 mg | ORAL_TABLET | Freq: Two times a day (BID) | ORAL | 6 refills | Status: DC

## 2016-06-02 NOTE — Progress Notes (Signed)
Clinical Summary Ms. Tsukamoto is a 83 y.o.female seen today for follow up of the following medical problems.   1. Afib - 10/2017she completed an event monitor that showed episodes of aflutter and afib.  - CHADS2VASC is 5, she is on eliquis - echo with LVEF 55-60%, no WMAs - only 2 episodes of palpitaitons, short in duration since last visit - no bleeding troubles on eliquis.     SH: works at [employer].    Past Medical History:  Diagnosis Date  . Diabetes mellitus without complication (HCC)   . Hyperlipidemia   . Hypertension   . Hypothyroidism   . Palpitations      No Known Allergies   Current Outpatient Prescriptions  Medication Sig Dispense Refill  . apixaban (ELIQUIS) 5 MG TABS tablet Take 1 tablet (5 mg total) by mouth 2 (two) times daily. 42 tablet 0  . Cholecalciferol (VITAMIN D PO) Take by mouth.    . levothyroxine (SYNTHROID, LEVOTHROID) 100 MCG tablet Take 100 mcg by mouth daily before breakfast.    . lisinopril (PRINIVIL,ZESTRIL) 5 MG tablet Take 5 mg by mouth daily.    . metFORMIN (GLUCOPHAGE-XR) 500 MG 24 hr tablet Take 500 mg by mouth daily with breakfast.    . metoprolol tartrate (LOPRESSOR) 25 MG tablet Take 0.5 tablets (12.5 mg total) by mouth 2 (two) times daily. 90 tablet 3  . pravastatin (PRAVACHOL) 20 MG tablet Take 20 mg by mouth daily.     No current facility-administered medications for this visit.      No past surgical history on file.   No Known Allergies    No family history on file.   Social History Ms. Imhoff reports that she has never smoked. She has never used smokeless tobacco. Ms. Pask has no alcohol history on file.   Review of Systems CONSTITUTIONAL: No weight loss, fever, chills, weakness or fatigue.  HEENT: Eyes: No visual loss, blurred vision, double vision or yellow sclerae.No hearing loss, sneezing, congestion, runny nose or sore throat.  SKIN: No rash or itching.  CARDIOVASCULAR: per hpi RESPIRATORY: No  shortness of breath, cough or sputum.  GASTROINTESTINAL: No anorexia, nausea, vomiting or diarrhea. No abdominal pain or blood.  GENITOURINARY: No burning on urination, no polyuria NEUROLOGICAL: No headache, dizziness, syncope, paralysis, ataxia, numbness or tingling in the extremities. No change in bowel or bladder control.  MUSCULOSKELETAL: No muscle, back pain, joint pain or stiffness.  LYMPHATICS: No enlarged nodes. No history of splenectomy.  PSYCHIATRIC: No history of depression or anxiety.  ENDOCRINOLOGIC: No reports of sweating, cold or heat intolerance. No polyuria or polydipsia.  Marland Kitchen   Physical Examination Vitals:   06/02/16 0957  BP: 125/73  Pulse: (!) 59  Resp: (!) 95   Vitals:   06/02/16 0957  Weight: 194 lb 12.8 oz (88.4 kg)  Height: 5\' 6"  (1.676 m)    Gen: resting comfortably, no acute distress HEENT: no scleral icterus, pupils equal round and reactive, no palptable cervical adenopathy,  CV Resp: Clear to auscultation bilaterally GI: abdomen is soft, non-tender, non-distended, normal bowel sounds, no hepatosplenomegaly MSK: extremities are warm, no edema.  Skin: warm, no rash Neuro:  no focal deficits Psych: appropriate affect   Diagnostic Studies 12/2015 event monitor  Telemetry tracings show sinus rhythm along with episodes of atrial flutter with variable conduction and atrial fibrillation.  Episodes of both atrial flutter and atrial fibrillations, at times with elevated heart rates 100-120s  Reported symptoms correlated to episode of  aflutter with RVR   02/2016 echo Study Conclusions  - Left ventricle: The cavity size was normal. Wall thickness was   normal. Systolic function was normal. The estimated ejection   fraction was in the range of 55% to 60%. Wall motion was normal;   there were no regional wall motion abnormalities. Left   ventricular diastolic function parameters were normal. - Aortic valve: Mildly calcified annulus. Mildly thickened    leaflets. Valve area (VTI): 2.1 cm^2. Valve area (Vmax): 2.61   cm^2. Valve area (Vmean): 2 cm^2. - Technically difficult study.   Assessment and Plan  1. PAF/Paroxysmal aflutter - overall doing well, limited palpitations - we will continue current meds - continue anticoag, CHADS2Vasc score is 5  F/u 6 months.       Antoine PocheJonathan F. Branch, M.D.

## 2016-06-02 NOTE — Patient Instructions (Signed)

## 2016-07-10 ENCOUNTER — Telehealth: Payer: Self-pay | Admitting: Cardiology

## 2016-07-10 ENCOUNTER — Other Ambulatory Visit: Payer: Self-pay

## 2016-07-10 MED ORDER — APIXABAN 5 MG PO TABS: 5.0000 mg | ORAL_TABLET | Freq: Two times a day (BID) | ORAL | 3 refills | Status: DC

## 2016-07-10 NOTE — Telephone Encounter (Signed)
apixaban (ELIQUIS) 5 MG TABS tablet   Send RX to mail order   [facility]

## 2016-11-02 ENCOUNTER — Other Ambulatory Visit: Payer: Self-pay | Admitting: Cardiology

## 2016-11-21 ENCOUNTER — Encounter: Payer: Self-pay | Admitting: Cardiology

## 2016-11-21 ENCOUNTER — Ambulatory Visit (INDEPENDENT_AMBULATORY_CARE_PROVIDER_SITE_OTHER): Payer: Medicare Other | Admitting: Cardiology

## 2016-11-21 VITALS — BP 124/78 | HR 60 | Ht 66.0 in | Wt 198.0 lb

## 2016-11-21 DIAGNOSIS — I10 Essential (primary) hypertension: Secondary | ICD-10-CM | POA: Diagnosis not present

## 2016-11-21 DIAGNOSIS — I4891 Unspecified atrial fibrillation: Secondary | ICD-10-CM

## 2016-11-21 DIAGNOSIS — E782 Mixed hyperlipidemia: Secondary | ICD-10-CM

## 2016-11-21 NOTE — Patient Instructions (Signed)

## 2016-11-21 NOTE — Progress Notes (Signed)
Clinical Summary [name] is a 83 y.o.female seen today for follow up of the following medical problems.   1. Afib - 10/2017she completed an event monitor that showed episodes of aflutter and afib.  - CHADS2VASC is 5, she is on eliquis - echo with LVEF 55-60%, no WMAs   - she denies any palpitations since our last visit. No bleeding on eliquis -she is  compliant with meds   2. HTN - she is compliant with bp meds  3. HL 11/2015 TC 163 TG 147 HDL 54 LDL 80 - she reports upcoming labs with pcp   SH: works at [employer].    Past Medical History:  Diagnosis Date  . Diabetes mellitus without complication (HCC)   . Hyperlipidemia   . Hypertension   . Hypothyroidism   . Palpitations      No Known Allergies   Current Outpatient Prescriptions  Medication Sig Dispense Refill  . Cholecalciferol (VITAMIN D PO) Take by mouth.    Everlene Balls. ELIQUIS 5 MG TABS tablet TAKE 1 TABLET BY MOUTH TWO  TIMES DAILY 60 tablet 6  . levothyroxine (SYNTHROID, LEVOTHROID) 100 MCG tablet Take 100 mcg by mouth daily before breakfast.    . lisinopril (PRINIVIL,ZESTRIL) 5 MG tablet Take 5 mg by mouth daily.    . metFORMIN (GLUCOPHAGE-XR) 500 MG 24 hr tablet Take 500 mg by mouth daily with breakfast.    . metoprolol tartrate (LOPRESSOR) 25 MG tablet Take 25 mg by mouth 2 (two) times daily.    . pravastatin (PRAVACHOL) 20 MG tablet Take 20 mg by mouth daily.     No current facility-administered medications for this visit.      No past surgical history on file.   No Known Allergies    Family History  Problem Relation Age of Onset  . Heart attack Father   . Breast cancer Sister   . Stroke Sister   . Lung cancer Brother   . Prostate cancer Brother   . Heart attack Brother      Social History [name] reports that she has never smoked. She has never used smokeless tobacco. [name] has no alcohol history on file.   Review of Systems CONSTITUTIONAL: No weight loss, fever, chills,  weakness or fatigue.  HEENT: Eyes: No visual loss, blurred vision, double vision or yellow sclerae.No hearing loss, sneezing, congestion, runny nose or sore throat.  SKIN: No rash or itching.  CARDIOVASCULAR: per hpi RESPIRATORY: No shortness of breath, cough or sputum.  GASTROINTESTINAL: No anorexia, nausea, vomiting or diarrhea. No abdominal pain or blood.  GENITOURINARY: No burning on urination, no polyuria NEUROLOGICAL: No headache, dizziness, syncope, paralysis, ataxia, numbness or tingling in the extremities. No change in bowel or bladder control.  MUSCULOSKELETAL: No muscle, back pain, joint pain or stiffness.  LYMPHATICS: No enlarged nodes. No history of splenectomy.  PSYCHIATRIC: No history of depression or anxiety.  ENDOCRINOLOGIC: No reports of sweating, cold or heat intolerance. No polyuria or polydipsia.  Marland Kitchen.   Physical Examination Vitals:   11/21/16 1320  BP: 124/78  Pulse: 60  SpO2: 96%   Vitals:   11/21/16 1320  Weight: 198 lb (89.8 kg)  Height: 5\' 6"  (1.676 m)    Gen: resting comfortably, no acute distress HEENT: no scleral icterus, pupils equal round and reactive, no palptable cervical adenopathy,  CV: RRR, no m/r/g, no jvd Resp: Clear to auscultation bilaterally GI: abdomen is soft, non-tender, non-distended, normal bowel sounds, no hepatosplenomegaly MSK: extremities are  warm, no edema.  Skin: warm, no rash Neuro:  no focal deficits Psych: appropriate affect   Diagnostic Studies 12/2015 event monitor  Telemetry tracings show sinus rhythm along with episodes of atrial flutter with variable conduction and atrial fibrillation.  Episodes of both atrial flutter and atrial fibrillations, at times with elevated heart rates 100-120s  Reported symptoms correlated to episode of aflutter with RVR   02/2016 echo Study Conclusions  - Left ventricle: The cavity size was normal. Wall thickness was normal. Systolic function was normal. The estimated  ejection fraction was in the range of 55% to 60%. Wall motion was normal; there were no regional wall motion abnormalities. Left ventricular diastolic function parameters were normal. - Aortic valve: Mildly calcified annulus. Mildly thickened leaflets. Valve area (VTI): 2.1 cm^2. Valve area (Vmax): 2.61 cm^2. Valve area (Vmean): 2 cm^2. - Technically difficult study.    Assessment and Plan  1. PAF/Paroxysmal aflutter - no recent symptoms, continue current meds - her CHADS2Vasc score is 5, continue anticoag  2. HTN - at goal, continue current meds  3. Hyperlipidemia - request labs from pcp - continue statin  F/u 6 months.       Antoine Poche, M.D.
